# Patient Record
Sex: Male | Born: 1975 | Race: Black or African American | Hispanic: No | Marital: Married | Smoking: Never smoker
Health system: Southern US, Community
[De-identification: ages and names within clinical notes are randomized; demographics above are authoritative.]

---

## 2016-01-31 ENCOUNTER — Emergency Department (HOSPITAL_COMMUNITY)
Admission: EM | Admit: 2016-01-31 | Discharge: 2016-01-31 | Disposition: A | Discharge status: Home / Self Care | Payer: Managed Care, Other (non HMO) | Attending: Emergency Medicine | Admitting: Emergency Medicine

## 2016-01-31 ENCOUNTER — Encounter (HOSPITAL_COMMUNITY): Payer: Self-pay | Admitting: Family Medicine

## 2016-01-31 ENCOUNTER — Emergency Department (HOSPITAL_COMMUNITY): Payer: Managed Care, Other (non HMO)

## 2016-01-31 DIAGNOSIS — M25562 Pain in left knee: Secondary | ICD-10-CM | POA: Diagnosis present

## 2016-01-31 DIAGNOSIS — Z791 Long term (current) use of non-steroidal anti-inflammatories (NSAID): Secondary | ICD-10-CM | POA: Insufficient documentation

## 2016-01-31 MED ORDER — NAPROXEN 500 MG PO TABS: 500.0000 mg | ORAL_TABLET | Freq: Two times a day (BID) | ORAL | Status: DC

## 2016-01-31 NOTE — Discharge Instructions (Signed)
Take your medications as prescribed for pain relief. I recommend resting, elevating and applying ice to her left knee for 15-20 minutes 3-4 times daily. Please follow up with a primary care provider from the Resource Guide provided below in one week if your symptoms have not improved. Please return to the Emergency Department if symptoms worsen or new onset of fever, redness, swelling, warmth, numbness, tingling, weakness.

## 2016-01-31 NOTE — ED Notes (Signed)
Pt here for left knee pain x 1 month. sts pain on both sides of knee cap and behind knee. Denies injury.

## 2016-01-31 NOTE — ED Provider Notes (Signed)
CSN: [id number]     Arrival date & time 01/31/16  0915 History  By signing my name below, I, Evon Slack, attest that this documentation has been prepared under the direction and in the presence of [employer], [place]. Electronically Signed: Evon Slack, ED Scribe. 01/31/2016. 10:51 AM.    Chief Complaint  Patient presents with  . Knee Pain   The history is provided by the patient. No language interpreter was used.   HPI Comments: Robert Cunningham is a 40 y.o. male who presents to the Emergency Department complaining of worsening left knee pain onset 1 month prior. Pt doesn't report any associated symptoms. He states that the pain is worse when ambulating or bearing weight. He states that that the pain is worse when fully extending the knee or standing for long periods of time. Pt denies any medications PTA. Pt denies fever, swelling, numbness or tingling. Pt denies injury or trauma to the knee.   History reviewed. No pertinent past medical history. History reviewed. No pertinent past surgical history. History reviewed. No pertinent family history. Social History  Substance Use Topics  . Smoking status: Never Smoker   . Smokeless tobacco: None  . Alcohol Use: None    Review of Systems  Constitutional: Negative for fever.  Musculoskeletal: Positive for arthralgias. Negative for joint swelling.  Neurological: Negative for numbness.     Allergies  Review of patient's allergies indicates no known allergies.  Home Medications   Prior to Admission medications   Medication Sig Start Date End Date Taking? Authorizing Provider  naproxen (NAPROSYN) 500 MG tablet Take 1 tablet (500 mg total) by mouth 2 (two) times daily. 01/31/16   Satira Sark Nadeau, PA-C   BP 140/76 mmHg  Pulse 78  Temp(Src) 98.3 F (36.8 C) (Oral)  Resp 16  Wt 149.687 kg  SpO2 100%   Physical Exam  Constitutional: He is oriented to person, place, and time. He appears well-developed and  well-nourished.  HENT:  Head: Normocephalic and atraumatic.  Eyes: Conjunctivae and EOM are normal. Right eye exhibits no discharge. Left eye exhibits no discharge. No scleral icterus.  Neck: Normal range of motion. Neck supple.  Cardiovascular: Normal rate.   Pulmonary/Chest: Effort normal.  Musculoskeletal: Normal range of motion. He exhibits tenderness.       Left knee: He exhibits normal range of motion, no swelling, no effusion, no ecchymosis, no deformity, no laceration, no erythema, no LCL laxity, normal patellar mobility and no MCL laxity. Tenderness found. Medial joint line, lateral joint line and patellar tendon tenderness noted.  2+ Pt pulse, sensation grossly intact, pt able to stand and ambulate without assistance.   Neurological: He is alert and oriented to person, place, and time.  Skin: Skin is warm and dry.  Nursing note and vitals reviewed.   ED Course  Procedures (including critical care time) DIAGNOSTIC STUDIES: Oxygen Saturation is 97% on RA, normal by my interpretation.    COORDINATION OF CARE: 10:49 AM-Discussed treatment plan with pt at bedside and pt agreed to plan.     Labs Review Labs Reviewed - No data to display  Imaging Review Dg Knee Complete 4 Views Left  01/31/2016  CLINICAL DATA:  Pain for 1 month EXAM: LEFT KNEE - COMPLETE 4+ VIEW COMPARISON:  None. FINDINGS: Frontal, lateral, and bilateral oblique views were obtained. There is no fracture or dislocation. No joint effusion. There is slight space narrowing medially. Other joint spaces appear normal. IMPRESSION: Slight joint space narrowing medially.  No  fracture or effusion. Electronically Signed   By: Bretta BangWilliam  Woodruff III M.D.   On: 01/31/2016 10:03   I have personally reviewed and evaluated these images as part of my medical decision-making.   EKG Interpretation None      MDM   Final diagnoses:  Left knee pain    Patient presents with left knee pain, denies any known injury or trauma.  VSS. Exam revealed diffuse mild tenderness to left knee with full range of motion. Left lower extremity neurovascularly intact. Left knee x-ray revealed slight joint space narrowing medially, no fracture or effusion. Discussed results and plan for discharge with patient. Plan to discharge patient home with symptomatic treatment and RICE protocol. Advised patient to follow up with his PCP if his symptoms have not improved. Discussed return precautions with patient.  I personally performed the services described in this documentation, which was scribed in my presence. The recorded information has been reviewed and is accurate.      Satira Sarkicole Elizabeth CorwithNadeau, [place]-C 02/01/16 1043  Pricilla LovelessScott Goldston, MD 02/06/16 [phone]

## 2016-01-31 NOTE — ED Notes (Signed)
Pt is in stable condition upon d/c and ambulates from ED. 

## 2019-11-06 ENCOUNTER — Emergency Department (HOSPITAL_COMMUNITY)
Admission: EM | Admit: 2019-11-06 | Discharge: 2019-11-06 | Disposition: A | Discharge status: Home / Self Care | Payer: BC Managed Care – PPO | Attending: Emergency Medicine | Admitting: Emergency Medicine

## 2019-11-06 ENCOUNTER — Emergency Department (HOSPITAL_COMMUNITY): Payer: BC Managed Care – PPO

## 2019-11-06 ENCOUNTER — Other Ambulatory Visit: Payer: Self-pay

## 2019-11-06 ENCOUNTER — Encounter (HOSPITAL_COMMUNITY): Payer: Self-pay | Admitting: *Deleted

## 2019-11-06 DIAGNOSIS — Z79899 Other long term (current) drug therapy: Secondary | ICD-10-CM | POA: Diagnosis not present

## 2019-11-06 DIAGNOSIS — S82892A Other fracture of left lower leg, initial encounter for closed fracture: Secondary | ICD-10-CM

## 2019-11-06 DIAGNOSIS — Y929 Unspecified place or not applicable: Secondary | ICD-10-CM | POA: Insufficient documentation

## 2019-11-06 DIAGNOSIS — Y999 Unspecified external cause status: Secondary | ICD-10-CM | POA: Diagnosis not present

## 2019-11-06 DIAGNOSIS — Y9301 Activity, walking, marching and hiking: Secondary | ICD-10-CM | POA: Insufficient documentation

## 2019-11-06 DIAGNOSIS — S93402A Sprain of unspecified ligament of left ankle, initial encounter: Secondary | ICD-10-CM | POA: Diagnosis not present

## 2019-11-06 DIAGNOSIS — S92155A Nondisplaced avulsion fracture (chip fracture) of left talus, initial encounter for closed fracture: Secondary | ICD-10-CM | POA: Insufficient documentation

## 2019-11-06 DIAGNOSIS — X509XXA Other and unspecified overexertion or strenuous movements or postures, initial encounter: Secondary | ICD-10-CM | POA: Diagnosis not present

## 2019-11-06 DIAGNOSIS — S99912A Unspecified injury of left ankle, initial encounter: Secondary | ICD-10-CM | POA: Diagnosis present

## 2019-11-06 MED ORDER — IBUPROFEN 800 MG PO TABS: 800.0000 mg | ORAL_TABLET | Freq: Three times a day (TID) | ORAL | 0 refills | Status: AC

## 2019-11-06 NOTE — ED Triage Notes (Signed)
Pt says he "rolled" his left ankle last night when walking. C/o pain and swelling in the ankle. Took Excedrin for pain.

## 2019-11-06 NOTE — Progress Notes (Signed)
Orthopedic Tech Progress Note Patient Details:  Robert Cunningham 21-Jun-1976 [id number]  Ortho Devices Type of Ortho Device: CAM walker, Crutches Ortho Device/Splint Location: lle Ortho Device/Splint Interventions: Ordered, Application, Adjustment   Post Interventions Patient Tolerated: Well Instructions Provided: Care of device, Adjustment of device   Trinna Post 11/06/2019, 4:46 AM

## 2019-11-06 NOTE — Discharge Instructions (Addendum)
You have a small avulsion type fracture.  This is commonly associated with a bad ankle sprain.  You should follow-up with the orthopedic clinic listed.  Wear the boot and use the crutches until you are seen by them.  Keep the foot up and elevated.  Use ice several times a day for 20 minutes to help with swelling.

## 2019-11-06 NOTE — ED Notes (Signed)
Patient verbalizes understanding of discharge instructions. Opportunity for questioning and answers were provided. Armband removed by staff, pt discharged from ED. Pt. ambulatory and discharged home.  
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
## 2019-11-06 NOTE — ED Notes (Signed)
Ortho Tech paged.

## 2021-12-10 ENCOUNTER — Other Ambulatory Visit: Payer: Self-pay

## 2021-12-10 ENCOUNTER — Encounter (HOSPITAL_COMMUNITY): Payer: Self-pay

## 2021-12-10 ENCOUNTER — Emergency Department (HOSPITAL_COMMUNITY)
Admission: EM | Admit: 2021-12-10 | Discharge: 2021-12-10 | Disposition: A | Discharge status: Home / Self Care | Payer: No Typology Code available for payment source | Attending: Emergency Medicine | Admitting: Emergency Medicine

## 2021-12-10 ENCOUNTER — Emergency Department (HOSPITAL_COMMUNITY): Payer: No Typology Code available for payment source

## 2021-12-10 DIAGNOSIS — M545 Low back pain, unspecified: Secondary | ICD-10-CM | POA: Diagnosis not present

## 2021-12-10 DIAGNOSIS — S0003XA Contusion of scalp, initial encounter: Secondary | ICD-10-CM | POA: Diagnosis not present

## 2021-12-10 DIAGNOSIS — M546 Pain in thoracic spine: Secondary | ICD-10-CM | POA: Insufficient documentation

## 2021-12-10 DIAGNOSIS — M549 Dorsalgia, unspecified: Secondary | ICD-10-CM

## 2021-12-10 DIAGNOSIS — S0990XA Unspecified injury of head, initial encounter: Secondary | ICD-10-CM | POA: Diagnosis present

## 2021-12-10 DIAGNOSIS — Y92410 Unspecified street and highway as the place of occurrence of the external cause: Secondary | ICD-10-CM | POA: Insufficient documentation

## 2021-12-10 MED ORDER — NAPROXEN 500 MG PO TABS: 500.0000 mg | ORAL_TABLET | Freq: Two times a day (BID) | ORAL | 0 refills | Status: AC

## 2021-12-10 MED ORDER — LIDOCAINE 5 % EX PTCH: 1.0000 | MEDICATED_PATCH | CUTANEOUS | 0 refills | Status: AC

## 2021-12-10 MED ORDER — METHOCARBAMOL 500 MG PO TABS: 500.0000 mg | ORAL_TABLET | Freq: Two times a day (BID) | ORAL | 0 refills | Status: AC

## 2021-12-10 NOTE — ED Notes (Signed)
I provided reinforced discharge education based off of discharge instructions. Pt acknowledged and understood my education. Pt had no further questions/concerns for provider/myself.  °

## 2021-12-10 NOTE — Discharge Instructions (Addendum)
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
## 2023-03-11 ENCOUNTER — Other Ambulatory Visit: Payer: Self-pay

## 2023-03-11 ENCOUNTER — Encounter (HOSPITAL_COMMUNITY): Payer: Self-pay

## 2023-03-11 ENCOUNTER — Emergency Department (HOSPITAL_COMMUNITY): Payer: BC Managed Care – PPO

## 2023-03-11 ENCOUNTER — Emergency Department (HOSPITAL_COMMUNITY)
Admission: EM | Admit: 2023-03-11 | Discharge: 2023-03-11 | Disposition: A | Discharge status: Home / Self Care | Payer: BC Managed Care – PPO | Attending: Emergency Medicine | Admitting: Emergency Medicine

## 2023-03-11 DIAGNOSIS — N50811 Right testicular pain: Secondary | ICD-10-CM

## 2023-03-11 DIAGNOSIS — N433 Hydrocele, unspecified: Secondary | ICD-10-CM | POA: Insufficient documentation

## 2023-03-11 LAB — CBC
HCT: 46.1 % (ref 39.0–52.0)
Hemoglobin: 15.8 g/dL (ref 13.0–17.0)
MCH: 29.9 pg (ref 26.0–34.0)
MCHC: 34.3 g/dL (ref 30.0–36.0)
MCV: 87.3 fL (ref 80.0–100.0)
Platelets: 214 10*3/uL (ref 150–400)
RBC: 5.28 MIL/uL (ref 4.22–5.81)
RDW: 13.2 % (ref 11.5–15.5)
WBC: 6.9 10*3/uL (ref 4.0–10.5)
nRBC: 0 % (ref 0.0–0.2)

## 2023-03-11 LAB — URINALYSIS, ROUTINE W REFLEX MICROSCOPIC
Bilirubin Urine: NEGATIVE
Glucose, UA: NEGATIVE mg/dL
Hgb urine dipstick: NEGATIVE
Ketones, ur: NEGATIVE mg/dL
Nitrite: NEGATIVE
Protein, ur: NEGATIVE mg/dL
Specific Gravity, Urine: 1.011 (ref 1.005–1.030)
pH: 7 (ref 5.0–8.0)

## 2023-03-11 LAB — COMPREHENSIVE METABOLIC PANEL
ALT: 13 U/L (ref 0–44)
AST: 17 U/L (ref 15–41)
Albumin: 3.5 g/dL (ref 3.5–5.0)
Alkaline Phosphatase: 61 U/L (ref 38–126)
Anion gap: 9 (ref 5–15)
BUN: 12 mg/dL (ref 6–20)
CO2: 22 mmol/L (ref 22–32)
Calcium: 8.6 mg/dL — ABNORMAL LOW (ref 8.9–10.3)
Chloride: 107 mmol/L (ref 98–111)
Creatinine, Ser: 1.25 mg/dL — ABNORMAL HIGH (ref 0.61–1.24)
GFR, Estimated: >60 mL/min (ref >60)
Glucose, Bld: 130 mg/dL — ABNORMAL HIGH (ref 70–99)
Potassium: 4.2 mmol/L (ref 3.5–5.1)
Sodium: 138 mmol/L (ref 135–145)
Total Bilirubin: 0.9 mg/dL (ref 0.3–1.2)
Total Protein: 6.4 g/dL — ABNORMAL LOW (ref 6.5–8.1)

## 2023-03-11 MED ORDER — CEFTRIAXONE SODIUM 1 G IJ SOLR
1.0000 g | Freq: Once | INTRAMUSCULAR | Status: AC
  Administered 2023-03-11: 1 g via INTRAMUSCULAR
  Filled 2023-03-11: qty 10

## 2023-03-11 MED ORDER — KETOROLAC TROMETHAMINE 15 MG/ML IJ SOLN
INTRAMUSCULAR | Status: AC
  Administered 2023-03-11: 15 mg via INTRAMUSCULAR
  Filled 2023-03-11: qty 1

## 2023-03-11 MED ORDER — DOXYCYCLINE HYCLATE 100 MG PO CAPS: 100.0000 mg | ORAL_CAPSULE | Freq: Two times a day (BID) | ORAL | 0 refills | Status: AC

## 2023-03-11 MED ORDER — FENTANYL CITRATE PF 50 MCG/ML IJ SOSY
PREFILLED_SYRINGE | INTRAMUSCULAR | Status: AC
  Filled 2023-03-11: qty 2

## 2023-03-11 MED ORDER — ONDANSETRON 4 MG PO TBDP
4.0000 mg | ORAL_TABLET | Freq: Once | ORAL | Status: AC
  Administered 2023-03-11: 4 mg via ORAL
  Filled 2023-03-11: qty 1

## 2023-03-11 MED ORDER — FENTANYL CITRATE PF 50 MCG/ML IJ SOSY
100.0000 ug | PREFILLED_SYRINGE | Freq: Once | INTRAMUSCULAR | Status: AC
  Administered 2023-03-11: 100 ug via INTRAMUSCULAR

## 2023-03-11 MED ORDER — KETOROLAC TROMETHAMINE 15 MG/ML IJ SOLN: 15.0000 mg | Freq: Once | INTRAMUSCULAR | Status: AC

## 2023-03-11 MED ORDER — LIDOCAINE HCL (PF) 1 % IJ SOLN
INTRAMUSCULAR | Status: AC
  Administered 2023-03-11: 2.1 mL
  Filled 2023-03-11: qty 5

## 2023-03-11 MED ORDER — HYDROCODONE-ACETAMINOPHEN 5-325 MG PO TABS: 2.0000 | ORAL_TABLET | ORAL | 0 refills | Status: AC | PRN

## 2023-03-11 NOTE — ED Provider Notes (Signed)
[place] EMERGENCY DEPARTMENT AT [facility] Provider Note   CSN: [id number] Arrival date & time: 03/11/23  3295     History  Chief Complaint  Patient presents with   Testicle Pain    Robert Cunningham is a 47 y.o. male.  Patient presents to the emergency department complaining of right-sided testicular pain.  Patient rates pain at 10 out of 10 in severity.  He states that he has previously had fluid surgically removed from his testicles,: Chart review shows bilateral hydrocelectomy.  The patient denies any pain until today since having the procedure on June 26.  He denies any injury to the area.  He denies penile discharge, dysuria.  He states that his right testicle appears to be significantly swollen compared to what it was yesterday.  Past medical history otherwise noncontributory  HPI     Home Medications Prior to Admission medications   Medication Sig Start Date End Date Taking? Authorizing Provider  doxycycline (VIBRAMYCIN) 100 MG capsule Take 1 capsule (100 mg total) by mouth 2 (two) times daily. 03/11/23  Yes Darrick Grinder, PA-C  HYDROcodone-acetaminophen (NORCO/VICODIN) 5-325 MG tablet Take 2 tablets by mouth every 4 (four) hours as needed. 03/11/23  Yes Darrick Grinder, PA-C  ibuprofen (ADVIL) 800 MG tablet Take 1 tablet (800 mg total) by mouth 3 (three) times daily. 11/06/19   Roxy Horseman, PA-C  lidocaine (LIDODERM) 5 % Place 1 patch onto the skin daily. Remove & Discard patch within 12 hours or as directed by MD 12/10/21   Henderly, Britni A, PA-C  methocarbamol (ROBAXIN) 500 MG tablet Take 1 tablet (500 mg total) by mouth 2 (two) times daily. 12/10/21   Henderly, Britni A, PA-C  naproxen (NAPROSYN) 500 MG tablet Take 1 tablet (500 mg total) by mouth 2 (two) times daily. 12/10/21   Henderly, Britni A, PA-C      Allergies    Patient has no known allergies.    Review of Systems   Review of Systems  Physical Exam Updated Vital Signs BP (!) 145/92   Pulse  88   Temp 98.8 F (37.1 C)   Resp (!) 24   SpO2 97%  Physical Exam Vitals and nursing note reviewed. Exam conducted with a chaperone present.  HENT:     Head: Normocephalic and atraumatic.  Eyes:     Pupils: Pupils are equal, round, and reactive to light.  Pulmonary:     Effort: Pulmonary effort is normal. No respiratory distress.  Genitourinary:    Testes:        Right: Tenderness, swelling and testicular hydrocele present.        Left: Testicular hydrocele present. Tenderness not present.  Musculoskeletal:        General: No signs of injury.     Cervical back: Normal range of motion.  Skin:    General: Skin is dry.  Neurological:     Mental Status: He is alert.  Psychiatric:        Speech: Speech normal.        Behavior: Behavior normal.     ED Results / Procedures / Treatments   Labs (all labs ordered are listed, but only abnormal results are displayed) Labs Reviewed  URINALYSIS, ROUTINE W REFLEX MICROSCOPIC - Abnormal; Notable for the following components:      Result Value   Leukocytes,Ua TRACE (*)    Bacteria, UA RARE (*)    All other components within normal limits  COMPREHENSIVE METABOLIC PANEL - Abnormal;  Notable for the following components:   Glucose, Bld 130 (*)    Creatinine, Ser 1.25 (*)    Calcium 8.6 (*)    Total Protein 6.4 (*)    All other components within normal limits  CBC  GC/CHLAMYDIA PROBE AMP ([place]) NOT AT [facility]    EKG None  Radiology US SCROTUM W/DOPPLER  Result Date: 03/11/2023 CLINICAL DATA:  47 year old male with history of bilateral testicular pain and swelling. EXAM: SCROTAL ULTRASOUND DOPPLER ULTRASOUND OF THE TESTICLES TECHNIQUE: Complete ultrasound examination of the testicles, epididymis, and other scrotal structures was performed. Color and spectral Doppler ultrasound were also utilized to evaluate blood flow to the testicles. COMPARISON:  None Available. FINDINGS: Right testicle Measurements: 4.5 x 3.3 x 3.0 cm. No mass  or microlithiasis visualized. Left testicle Measurements: 5.0 x 2.6 x 2.9 cm. No mass or microlithiasis visualized. Right epididymis:  Normal in size and appearance. Left epididymis:  Normal in size and appearance. Hydrocele:  Bilateral hydroceles (right greater than left). Varicocele:  None visualized. Pulsed Doppler interrogation of both testes demonstrates normal low resistance arterial and venous waveforms bilaterally. Other: Near the midline between both testicles there is a well-defined hypoechoic fluid collection measuring approximately 7.1 x 2.9 x 4.5 cm which has some low-level internal echoes but no shadowing. Diffuse skin thickening. IMPRESSION: 1. Study is positive for bilateral hydroceles (right greater than left), as well as a midline fluid collection between the testicles which appears to have some internal debris. 2. Bilateral testicles and epididymides are sonographically normal. 3. Diffuse skin thickening on the scrotum. Electronically Signed   By: Trudie Reed M.D.   On: 03/11/2023 05:17    Procedures Procedures    Medications Ordered in ED Medications  cefTRIAXone (ROCEPHIN) injection 1 g (has no administration in time range)  ketorolac (TORADOL) 15 MG/ML injection 15 mg (15 mg Intramuscular Given 03/11/23 0452)  fentaNYL (SUBLIMAZE) injection 100 mcg ( Intramuscular Not Given 03/11/23 0522)    ED Course/ Medical Decision Making/ A&P                             Medical Decision Making Amount and/or Complexity of Data Reviewed Labs: ordered. Radiology: ordered.  Risk Prescription drug management.   This patient presents to the ED for concern of testicular pain, this involves an extensive number of treatment options, and is a complaint that carries with it a high risk of complications and morbidity.  The differential diagnosis includes torsion, hydrocele, epididymitis, orchitis, rupture, others   Co morbidities that complicate the patient evaluation  History of  hydrocelectomy   Additional history obtained:  External records from outside source obtained and reviewed including outpatient urology notes   Lab Tests:  I Ordered, and personally interpreted labs.  The pertinent results include: Creatinine 1.25, unremarkable CBC, UA with trace leukocytes and rare bacteria   Imaging Studies ordered:  I ordered imaging studies including ultrasound scrotum with Doppler I independently visualized and interpreted imaging which showed  1. Study is positive for bilateral hydroceles (right greater than  left), as well as a midline fluid collection between the testicles  which appears to have some internal debris.  2. Bilateral testicles and epididymides are sonographically normal.  3. Diffuse skin thickening on the scrotum   I agree with the radiologist interpretation   Consultations Obtained:  I requested consultation with the urologist,  Dr. Liliane Shi fracture,  and discussed lab and imaging findings as well as  pertinent plan - they recommend:  Discharge with follow up with urologist who performed surgery   Problem List / ED Course / Critical interventions / Medication management   I ordered medication including toradol, fentanyl for pain, rocephin for antibiotic coverage  Reevaluation of the patient after these medicines showed that the patient improved I have reviewed the patients home medicines and have made adjustments as needed   Social Determinants of Health:  Patient with no health insurance coverage   Test / Admission - Considered:  Patient with hydrocele and fluid collection concerning for possible infection.  Patient not septic at this time.  Patient case discussed with urology who recommends patient follow-up with his outside urologist who performed the surgery last month.  Will discharge with prescription for short course of Norco and doxycycline. Patient understands need for urgent follow up with his  urologist         Final Clinical Impression(s) / ED Diagnoses Final diagnoses:  Pain in both testicles  Hydrocele in adult    Rx / DC Orders ED Discharge Orders          Ordered    HYDROcodone-acetaminophen (NORCO/VICODIN) 5-325 MG tablet  Every 4 hours PRN        03/11/23 0549    doxycycline (VIBRAMYCIN) 100 MG capsule  2 times daily        03/11/23 0549              Darrick Grinder, PA-C 03/11/23 0555    Palumbo, April, MD 03/11/23 9629

## 2023-03-11 NOTE — Discharge Instructions (Signed)
You were evaluated today for testicular pain. Your ultrasound shows bilateral hydroceles and a fluid collection between.  1. Study is positive for bilateral hydroceles (right greater than  left), as well as a midline fluid collection between the testicles  which appears to have some internal debris.  2. Bilateral testicles and epididymides are sonographically normal.  3. Diffuse skin thickening on the scrotum   I prescribed antibiotics and a short course of pain medication. It is very important that you contact your surgeon for further evaluation and management.

## 2023-03-11 NOTE — ED Notes (Signed)
Pyxis not populating medications. Had to override Fentanyl and Torodol. Tried to call pharmacy and no answer.

## 2023-03-11 NOTE — ED Triage Notes (Signed)
Pt arrived from home via POV c/o testicle pain 10/10. Pt had fluid surgically removed from testicles about a month ago, pt has had no problems until today.
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
